# Patient Record
Sex: Female | Born: 1990 | Race: White | Hispanic: No | Marital: Single
Health system: Southern US, Community
[De-identification: ages and names within clinical notes are randomized; demographics above are authoritative.]

---
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
## 2012-08-23 ENCOUNTER — Emergency Department: Payer: Self-pay | Admitting: Emergency Medicine

## 2012-08-23 LAB — URINALYSIS, COMPLETE
Leukocyte Esterase: NEGATIVE
Nitrite: NEGATIVE
Ph: 6 (ref 4.5–8.0)
Protein: 100
RBC,UR: 8967 /HPF (ref 0–5)
Specific Gravity: 1.01 (ref 1.003–1.030)
Squamous Epithelial: 5
WBC UR: 35 /HPF (ref 0–5)

## 2012-08-23 LAB — COMPREHENSIVE METABOLIC PANEL
Alkaline Phosphatase: 69 U/L (ref 50–136)
BUN: 8 mg/dL (ref 7–18)
Bilirubin,Total: 0.4 mg/dL (ref 0.2–1.0)
Calcium, Total: 8.9 mg/dL (ref 8.5–10.1)
Creatinine: 0.71 mg/dL (ref 0.60–1.30)
EGFR (African American): >60
Glucose: 88 mg/dL (ref 65–99)
Potassium: 3.8 mmol/L (ref 3.5–5.1)
SGOT(AST): 22 U/L (ref 15–37)
SGPT (ALT): 17 U/L (ref 12–78)
Sodium: 137 mmol/L (ref 136–145)
Total Protein: 7.1 g/dL (ref 6.4–8.2)

## 2012-08-23 LAB — CBC
MCH: 31.7 pg (ref 26.0–34.0)
MCV: 91 fL (ref 80–100)
RBC: 4.12 10*6/uL (ref 3.80–5.20)

## 2012-09-17 ENCOUNTER — Ambulatory Visit: Payer: Self-pay | Admitting: Family Medicine

## 2013-06-02 ENCOUNTER — Emergency Department: Payer: Self-pay | Admitting: Emergency Medicine

## 2013-06-02 LAB — COMPREHENSIVE METABOLIC PANEL
ALBUMIN: 4.3 g/dL (ref 3.4–5.0)
Alkaline Phosphatase: 78 U/L
Anion Gap: 3 — ABNORMAL LOW (ref 7–16)
BUN: 13 mg/dL (ref 7–18)
Bilirubin,Total: 0.5 mg/dL (ref 0.2–1.0)
CALCIUM: 9.7 mg/dL (ref 8.5–10.1)
Chloride: 105 mmol/L (ref 98–107)
Co2: 29 mmol/L (ref 21–32)
Creatinine: 0.81 mg/dL (ref 0.60–1.30)
EGFR (African American): >60
Glucose: 125 mg/dL — ABNORMAL HIGH (ref 65–99)
Osmolality: 275 (ref 275–301)
Potassium: 4 mmol/L (ref 3.5–5.1)
SGOT(AST): 14 U/L — ABNORMAL LOW (ref 15–37)
SGPT (ALT): 19 U/L (ref 12–78)
Sodium: 137 mmol/L (ref 136–145)
TOTAL PROTEIN: 8.6 g/dL — AB (ref 6.4–8.2)

## 2013-06-02 LAB — ETHANOL
Ethanol %: <0.003 % (ref 0.000–0.080)
Ethanol: <3 mg/dL

## 2013-06-02 LAB — DRUG SCREEN, URINE
AMPHETAMINES, UR SCREEN: NEGATIVE (ref ?–1000)
BENZODIAZEPINE, UR SCRN: POSITIVE (ref ?–200)
Barbiturates, Ur Screen: NEGATIVE (ref ?–200)
Cannabinoid 50 Ng, Ur ~~LOC~~: NEGATIVE (ref ?–50)
Cocaine Metabolite,Ur ~~LOC~~: NEGATIVE (ref ?–300)
MDMA (ECSTASY) UR SCREEN: NEGATIVE (ref ?–500)
Methadone, Ur Screen: POSITIVE (ref ?–300)
OPIATE, UR SCREEN: NEGATIVE (ref ?–300)
Phencyclidine (PCP) Ur S: NEGATIVE (ref ?–25)
TRICYCLIC, UR SCREEN: NEGATIVE (ref ?–1000)

## 2013-06-02 LAB — URINALYSIS, COMPLETE
Bacteria: NONE SEEN
Bilirubin,UR: NEGATIVE
Glucose,UR: NEGATIVE mg/dL (ref 0–75)
Ketone: NEGATIVE
Leukocyte Esterase: NEGATIVE
Nitrite: NEGATIVE
Ph: 5 (ref 4.5–8.0)
Protein: 30
RBC,UR: 3 /HPF (ref 0–5)
Specific Gravity: 1.027 (ref 1.003–1.030)
Squamous Epithelial: 1
WBC UR: 3 /HPF (ref 0–5)

## 2013-06-02 LAB — CBC
HCT: 42.3 % (ref 35.0–47.0)
HGB: 13.8 g/dL (ref 12.0–16.0)
MCH: 29.2 pg (ref 26.0–34.0)
MCHC: 32.7 g/dL (ref 32.0–36.0)
MCV: 90 fL (ref 80–100)
Platelet: 321 10*3/uL (ref 150–440)
RBC: 4.73 10*6/uL (ref 3.80–5.20)
RDW: 13.5 % (ref 11.5–14.5)
WBC: 6.6 10*3/uL (ref 3.6–11.0)

## 2013-06-02 LAB — SALICYLATE LEVEL: Salicylates, Serum: 9.7 mg/dL — ABNORMAL HIGH

## 2013-06-02 LAB — ACETAMINOPHEN LEVEL

## 2013-10-02 ENCOUNTER — Emergency Department: Payer: Self-pay | Admitting: Emergency Medicine

## 2013-10-30 ENCOUNTER — Ambulatory Visit: Payer: Self-pay | Admitting: Family Medicine

## 2013-10-30 LAB — URINALYSIS, COMPLETE
BLOOD: NEGATIVE
Bacteria: NEGATIVE
Bilirubin,UR: NEGATIVE
GLUCOSE, UR: NEGATIVE
Ketone: NEGATIVE
LEUKOCYTE ESTERASE: NEGATIVE
Nitrite: NEGATIVE
PH: 8.5 (ref 5.0–8.0)
PROTEIN: NEGATIVE
RBC, UR: NONE SEEN /HPF (ref 0–5)
Specific Gravity: 1.015 (ref 1.000–1.030)
WBC UR: NONE SEEN /HPF (ref 0–5)

## 2013-10-30 LAB — PREGNANCY, URINE: Pregnancy Test, Urine: POSITIVE m[IU]/mL

## 2013-11-01 LAB — URINE CULTURE

## 2013-11-10 IMAGING — CT CT STONE STUDY
1 of 2 series · 15 of 32 positions shown, 19 images · non-contrast
Comparison: none

REASON FOR EXAM: right flank pain, hematuria
COMMENTS:

[Series 2: 3mm soft tissue · axial · 0.70mm/px · z∈[-896,-464]mm · 15 of 158 slices shown, 19 images]
[im 7/158  soft-tissue]
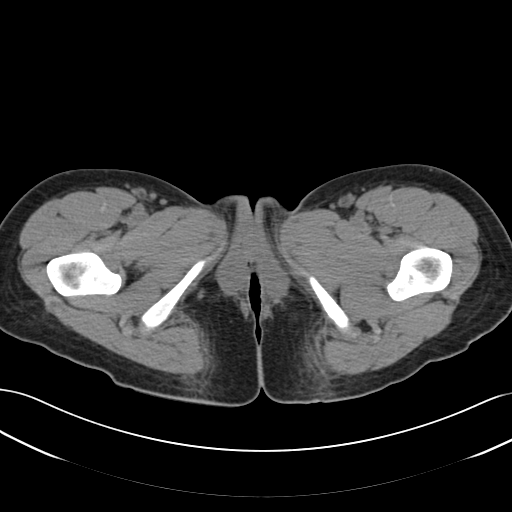
[im 7/158  bone]
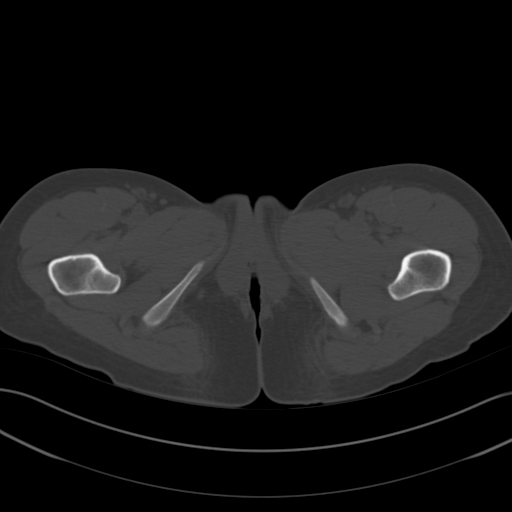
[im 20/158  soft-tissue]
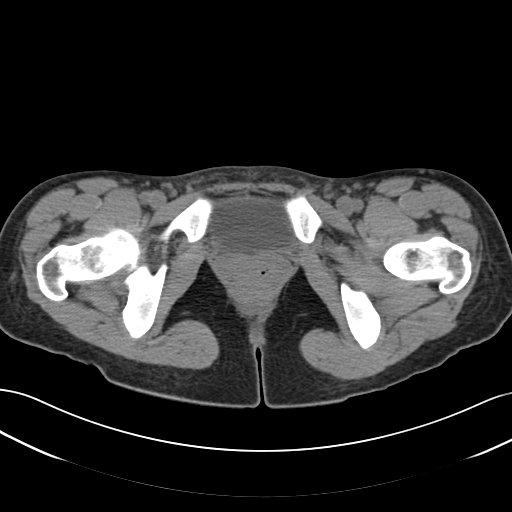
[im 33/158  soft-tissue]
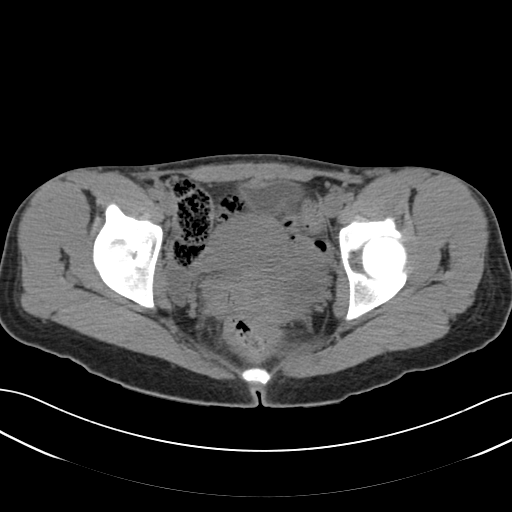
[im 46/158  soft-tissue]
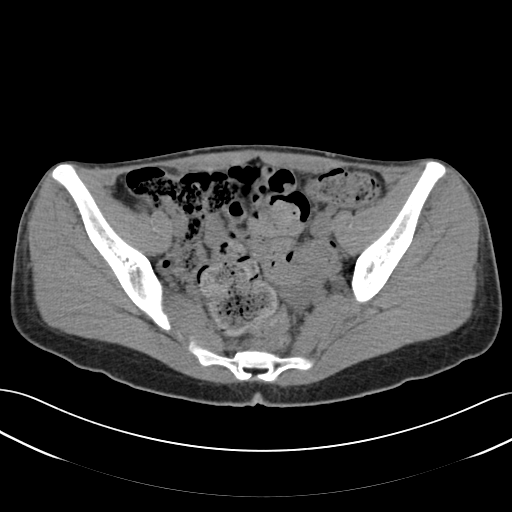
[im 53/158  soft-tissue]
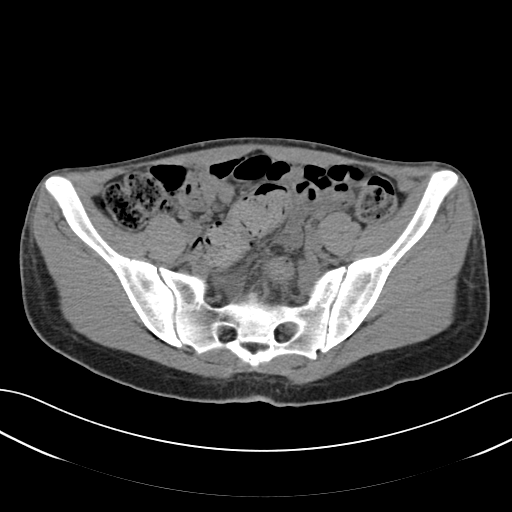
[im 66/158  soft-tissue]
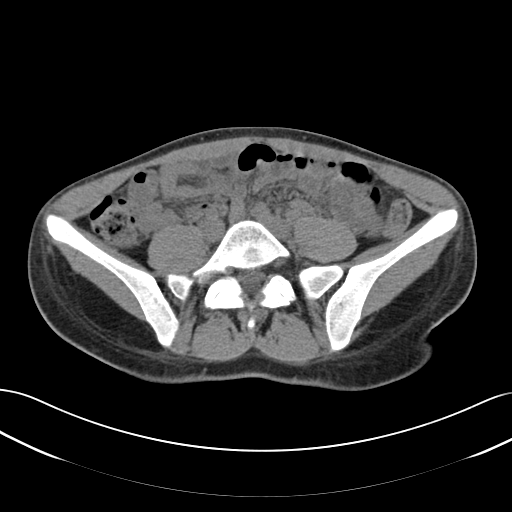
[im 79/158  soft-tissue]
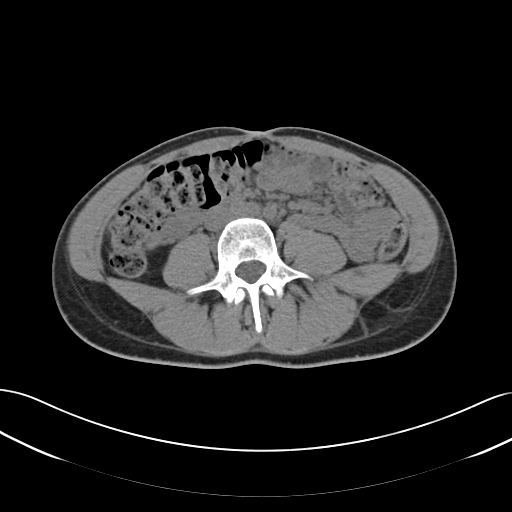
[im 92/158  soft-tissue]
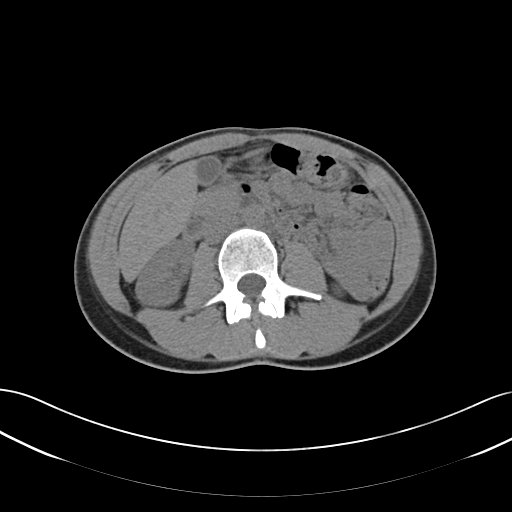
[im 105/158  soft-tissue]
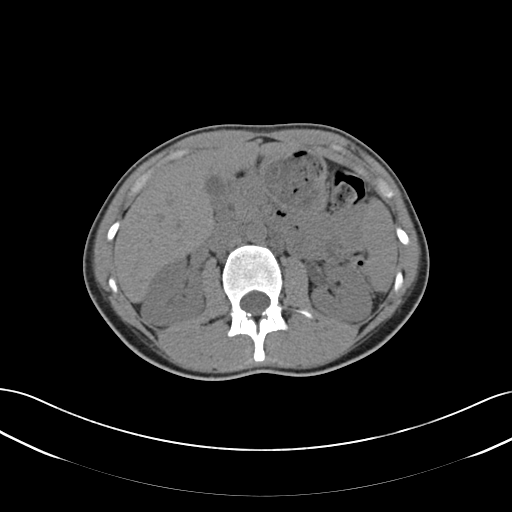
[im 105/158  bone]
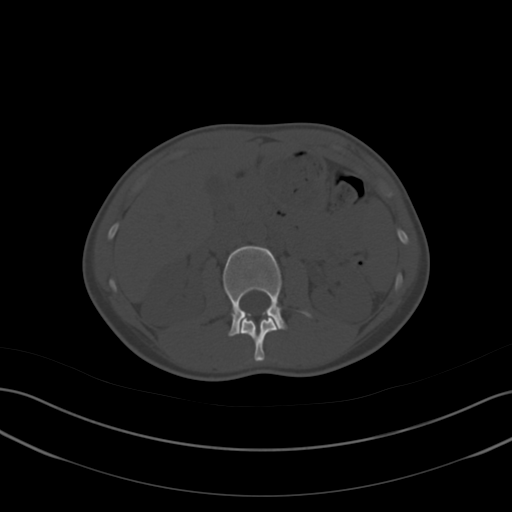
[im 112/158  soft-tissue]
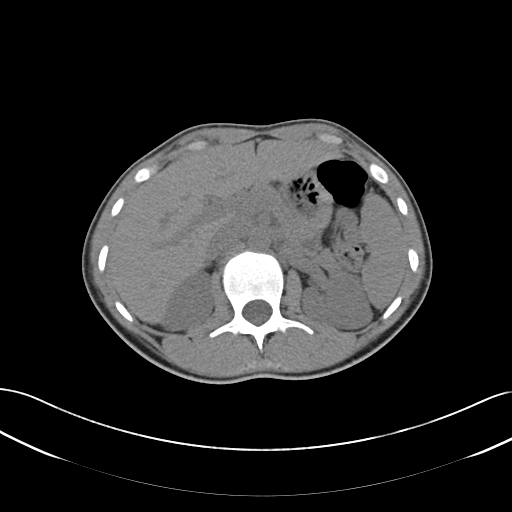
[im 125/158  soft-tissue]
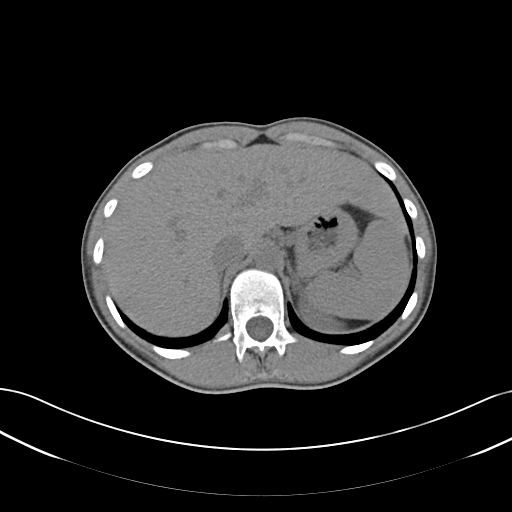
[im 131/158  lung]
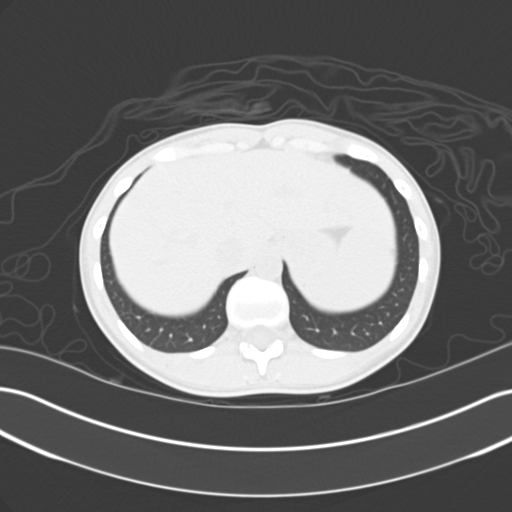
[im 138/158  soft-tissue]
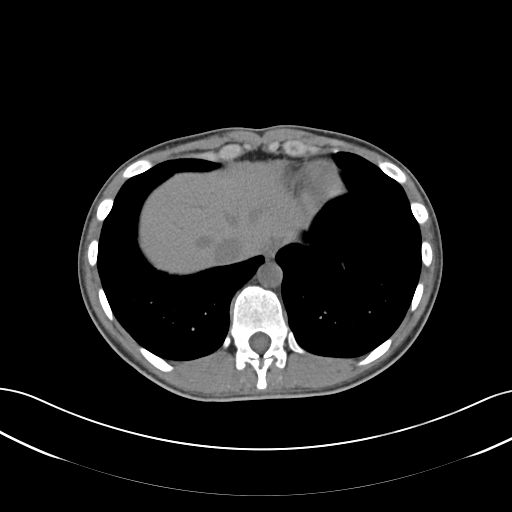
[im 138/158  lung]
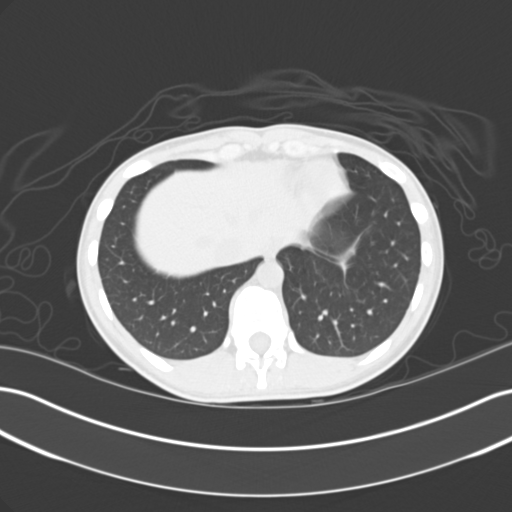
[im 144/158  lung]
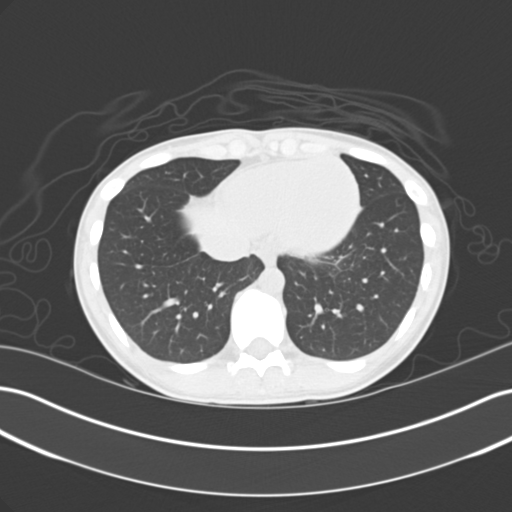
[im 151/158  soft-tissue]
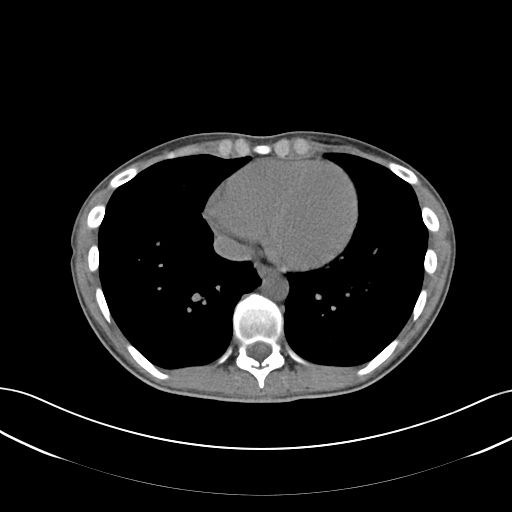
[im 151/158  lung]
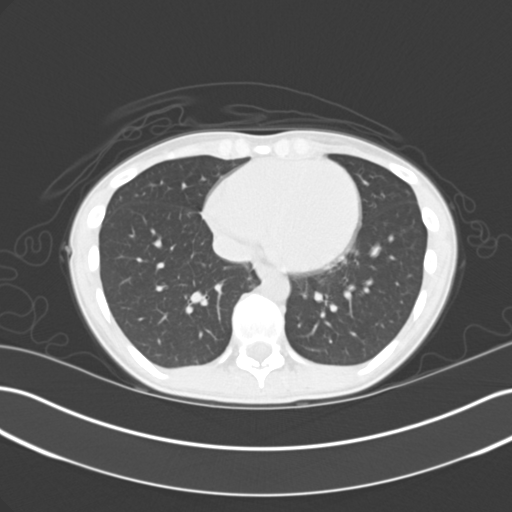

[15 of 32 positions shown; findings below may reference images not displayed]

PROCEDURE:     CT  - CT ABDOMEN /PELVIS WO (STONE)  - August 24, 2012  [DATE]

RESULT:     Axial noncontrast CT scanning was performed through the abdomen
and pelvis with reconstructions at 3 mm intervals and slice thicknesses.
Review of multiplanar reconstructed images was performed separately on the
VIA monitor.

The kidneys are normal in density and contour. No calcified stones are
evident and there is no hydronephrosis. No perinephric fluid collections are
demonstrated. Along the course of the ureters no calcified stones are
demonstrated. The partially distended urinary bladder is normal in
appearance.

There is a large amount of stool present throughout the colon that suggests
constipation. There is no evidence of ileus nor obstruction nor acute
inflammation. The liver, gallbladder, pancreas, spleen, nondistended
stomach, and adrenal glands are normal in appearance. The caliber of the
abdominal aorta is normal.

Within the pelvis there are low density structures bilaterally suggesting
cystic ovarian processes. On the left a 4.3 cm diameter presumed cyst
exhibits layering of radiodense material posteriorly within this structure
that may reflect hemorrhage. A similar smaller structure on the right
measures 2.4 x 1.6 cm. The uterus is normal in contour. There is a small
amount of free fluid in the pelvis. There is no inguinal nor umbilical
hernia. There is an umbilical ring present.

The lung bases are clear. The lumbar vertebral bodies are preserved in
height.
IMPRESSION: 1. There is no evidence of urinary tract stones or urinary tract obstruction.
2. There are nonspecific cystic adnexal processes. Given the appearance of
the structure on the left a hemorrhagic cyst is suspected. Correlation with
a pelvic ultrasound may be of value.
3. The bowel gas pattern suggests constipation.

A preliminary report was sent to the [HOSPITAL] the conclusion
of the study.

[REDACTED]

## 2013-11-16 ENCOUNTER — Emergency Department: Payer: Self-pay | Admitting: Emergency Medicine

## 2013-11-16 LAB — URINALYSIS, COMPLETE
BILIRUBIN, UR: NEGATIVE
BLOOD: NEGATIVE
GLUCOSE, UR: NEGATIVE mg/dL (ref 0–75)
KETONE: NEGATIVE
NITRITE: NEGATIVE
PH: 6 (ref 4.5–8.0)
PROTEIN: NEGATIVE
SPECIFIC GRAVITY: 1.014 (ref 1.003–1.030)
Squamous Epithelial: 10

## 2013-11-16 LAB — CBC
HCT: 36 % (ref 35.0–47.0)
HGB: 12.1 g/dL (ref 12.0–16.0)
MCH: 30.5 pg (ref 26.0–34.0)
MCHC: 33.6 g/dL (ref 32.0–36.0)
MCV: 91 fL (ref 80–100)
Platelet: 293 10*3/uL (ref 150–440)
RBC: 3.96 10*6/uL (ref 3.80–5.20)
RDW: 12.2 % (ref 11.5–14.5)
WBC: 6.9 10*3/uL (ref 3.6–11.0)

## 2013-11-16 LAB — HCG, QUANTITATIVE, PREGNANCY: BETA HCG, QUANT.: 133814 m[IU]/mL — AB

## 2013-11-16 LAB — WET PREP, GENITAL

## 2013-11-17 LAB — GC/CHLAMYDIA PROBE AMP

## 2013-12-02 ENCOUNTER — Emergency Department: Payer: Self-pay | Admitting: Emergency Medicine

## 2013-12-03 LAB — COMPREHENSIVE METABOLIC PANEL
ANION GAP: 7 (ref 7–16)
Albumin: 3.8 g/dL (ref 3.4–5.0)
Alkaline Phosphatase: 41 U/L — ABNORMAL LOW
BUN: 9 mg/dL (ref 7–18)
Bilirubin,Total: 0.4 mg/dL (ref 0.2–1.0)
CHLORIDE: 104 mmol/L (ref 98–107)
CO2: 25 mmol/L (ref 21–32)
CREATININE: 0.66 mg/dL (ref 0.60–1.30)
Calcium, Total: 8.5 mg/dL (ref 8.5–10.1)
GLUCOSE: 75 mg/dL (ref 65–99)
Osmolality: 269 (ref 275–301)
POTASSIUM: 3.5 mmol/L (ref 3.5–5.1)
SGOT(AST): 20 U/L (ref 15–37)
SGPT (ALT): 17 U/L
Sodium: 136 mmol/L (ref 136–145)
Total Protein: 7.2 g/dL (ref 6.4–8.2)

## 2013-12-03 LAB — CBC
HCT: 35.8 % (ref 35.0–47.0)
HGB: 11.9 g/dL — AB (ref 12.0–16.0)
MCH: 30.8 pg (ref 26.0–34.0)
MCHC: 33.3 g/dL (ref 32.0–36.0)
MCV: 93 fL (ref 80–100)
Platelet: 257 10*3/uL (ref 150–440)
RBC: 3.87 10*6/uL (ref 3.80–5.20)
RDW: 12.8 % (ref 11.5–14.5)
WBC: 9.7 10*3/uL (ref 3.6–11.0)

## 2013-12-03 LAB — URINALYSIS, COMPLETE
BILIRUBIN, UR: NEGATIVE
BLOOD: NEGATIVE
Glucose,UR: NEGATIVE mg/dL (ref 0–75)
Ketone: NEGATIVE
LEUKOCYTE ESTERASE: NEGATIVE
NITRITE: NEGATIVE
Ph: 5 (ref 4.5–8.0)
Protein: 100
RBC,UR: 1 /HPF (ref 0–5)
Specific Gravity: 1.017 (ref 1.003–1.030)
Squamous Epithelial: 6
WBC UR: 5 /HPF (ref 0–5)

## 2013-12-03 LAB — DRUG SCREEN, URINE
AMPHETAMINES, UR SCREEN: NEGATIVE (ref ?–1000)
Barbiturates, Ur Screen: NEGATIVE (ref ?–200)
Benzodiazepine, Ur Scrn: POSITIVE (ref ?–200)
COCAINE METABOLITE, UR ~~LOC~~: NEGATIVE (ref ?–300)
Cannabinoid 50 Ng, Ur ~~LOC~~: NEGATIVE (ref ?–50)
MDMA (Ecstasy)Ur Screen: NEGATIVE (ref ?–500)
METHADONE, UR SCREEN: NEGATIVE (ref ?–300)
Opiate, Ur Screen: POSITIVE (ref ?–300)
PHENCYCLIDINE (PCP) UR S: NEGATIVE (ref ?–25)
TRICYCLIC, UR SCREEN: NEGATIVE (ref ?–1000)

## 2013-12-03 LAB — ACETAMINOPHEN LEVEL: Acetaminophen: <2 ug/mL

## 2013-12-03 LAB — ETHANOL: Ethanol: <3 mg/dL

## 2013-12-03 LAB — SALICYLATE LEVEL

## 2013-12-03 LAB — HCG, QUANTITATIVE, PREGNANCY: Beta Hcg, Quant.: 167142 m[IU]/mL — ABNORMAL HIGH

## 2014-02-11 ENCOUNTER — Emergency Department: Payer: Self-pay | Admitting: Emergency Medicine

## 2014-03-28 ENCOUNTER — Emergency Department: Payer: Self-pay | Admitting: Emergency Medicine

## 2014-05-08 ENCOUNTER — Emergency Department
Admit: 2014-05-08 | Disposition: A | Discharge status: Home / Self Care | Payer: Self-pay | Admitting: Emergency Medicine

## 2014-05-08 LAB — COMPREHENSIVE METABOLIC PANEL
ANION GAP: 7 (ref 7–16)
Albumin: 4.6 g/dL
Alkaline Phosphatase: 55 U/L
BILIRUBIN TOTAL: 0.5 mg/dL
BUN: 8 mg/dL
CO2: 27 mmol/L
CREATININE: 0.69 mg/dL
Calcium, Total: 9.3 mg/dL
Chloride: 107 mmol/L
Glucose: 99 mg/dL
POTASSIUM: 3.4 mmol/L — AB
SGOT(AST): 26 U/L
SGPT (ALT): 42 U/L
SODIUM: 141 mmol/L
Total Protein: 7.6 g/dL

## 2014-05-08 LAB — URINALYSIS, COMPLETE
BILIRUBIN, UR: NEGATIVE
Bacteria: NONE SEEN
Blood: NEGATIVE
Glucose,UR: NEGATIVE mg/dL (ref 0–75)
Ketone: NEGATIVE
LEUKOCYTE ESTERASE: NEGATIVE
NITRITE: NEGATIVE
PROTEIN: NEGATIVE
Ph: 5 (ref 4.5–8.0)
Specific Gravity: 1.019 (ref 1.003–1.030)

## 2014-05-08 LAB — DRUG SCREEN, URINE
Amphetamines, Ur Screen: NEGATIVE
Barbiturates, Ur Screen: NEGATIVE
Benzodiazepine, Ur Scrn: POSITIVE
COCAINE METABOLITE, UR ~~LOC~~: NEGATIVE
Cannabinoid 50 Ng, Ur ~~LOC~~: POSITIVE
MDMA (Ecstasy)Ur Screen: NEGATIVE
Methadone, Ur Screen: NEGATIVE
OPIATE, UR SCREEN: NEGATIVE
PHENCYCLIDINE (PCP) UR S: NEGATIVE
Tricyclic, Ur Screen: NEGATIVE

## 2014-05-08 LAB — CBC
HCT: 40 % (ref 35.0–47.0)
HGB: 13.3 g/dL (ref 12.0–16.0)
MCH: 30.7 pg (ref 26.0–34.0)
MCHC: 33.3 g/dL (ref 32.0–36.0)
MCV: 92 fL (ref 80–100)
Platelet: 275 10*3/uL (ref 150–440)
RBC: 4.34 10*6/uL (ref 3.80–5.20)
RDW: 12.8 % (ref 11.5–14.5)
WBC: 5.5 10*3/uL (ref 3.6–11.0)

## 2014-05-08 LAB — ETHANOL

## 2014-05-08 NOTE — Consult Note (Signed)
PATIENT NAME:  Vanessa Ellis, Vanessa Ellis MR#:  [id number] DATE OF BIRTH:  1990/08/25  DATE OF CONSULTATION:  12/03/2013  REFERRING PHYSICIAN:   CONSULTING PHYSICIAN:  Audery AmelJohn T. Lillyana Majette, MD  IDENTIFYING INFORMATION AND REASON FOR CONSULTATION: A 24 year old woman brought into the hospital by police.   CHIEF COMPLAINT: "There so much going on."   HISTORY OF PRESENT ILLNESS: Information obtained from the patient and the chart. The patient states that [facility]. She admits that she used poor judgment and went inside and [place] [employer] [place]. She got arrested but became so emotionally overwrought that they brought her here to the hospital. There is also an allegation that she had been trying to hurt herself by pounding on her abdomen with her fists. The patient states that her mood has been depressed for several weeks. She feels down most of the time. Cries most of the time. Sleeps poorly. Poor appetite. She has been continuing to use benzodiazepines and narcotics, mostly Suboxone that she gets off the street. The patient denies however that she is having any suicidal ideation at all. Denies that she was trying to hurt herself when she was in jail. Denies any psychotic symptoms. She is not currently getting any outpatient psychiatric treatment and has not been getting any substance abuse treatment. Multiple stressors in her life include financially being overwhelmed, being estranged from her family and currently being pregnant.   PAST PSYCHIATRIC HISTORY: She was evaluated in the Emergency Room in May of this year for a similar situation and at that time also was released from the Emergency Room. She used to go to [facility] for outpatient care years ago, but has not been going in a long time. She denies that she has ever tried to kill herself in the past. Denies that she had been prescribed any medication for any depression or other psychiatric illness. She has tried to stop using  substances, but has continued to use Suboxone and Xanax that she buys on the street. No history of violence. No history of psychotic symptoms.   PAST MEDICAL HISTORY: The patient is currently pregnant. Ultrasound suggests a gestational age of [redacted] weeks. The patient says that she has known that she was pregnant for several weeks now and that she is planning to have an abortion, but has not been able to get the money together for it yet. She has not been getting any OB care. Denies having any other significant ongoing medical problems.   SOCIAL HISTORY: The patient lives with some friends. She says that they have gotten more fed up with her because of her substance use and she is not clear if she can go back and stay there. She is estranged from her family many of whom have substance abuse problems. She says that she is employed working at [employer]. Evidently got arrested for shoplifting yesterday.   FAMILY HISTORY: States that she has family members who also have substance abuse problems.   CURRENT MEDICATIONS: None.   ALLERGIES: No known drug allergies.   REVIEW OF SYSTEMS: Feeling sad. Poor sleep, poor appetite. Weak. Feeling depressed. Denies any auditory or visual hallucinations. Denies any suicidal or homicidal ideation. Denies any other specific acute physical symptoms.   MENTAL STATUS EXAMINATION: A somewhat disheveled young woman who looks her stated age. When I interviewed her she was very upset and was crying for most of the interview, but was cooperative. Eye contact intermittent. Psychomotor activity restricted. Speech was understandable through the crying.  When I went back later and she was no longer crying she was able to speak very normally. Normal rate, tone and volume. Affect sad but variable and controllable. Mood is stated as being depressed. Thoughts are lucid. No evidence of loosening of associations or delusions. Denies auditory or visual hallucinations. Denies any suicidal  ideation or wish to die. Denies homicidal ideation. She can recall 3/3 objects immediately and 2/3 at three minutes. She is alert and oriented x4. Baseline intelligence and fund of knowledge normal. Judgment and insight at the moment appears to be appropriate.   LABORATORY RESULTS: Has a drug screen positive for opiates and benzodiazepines. Urinalysis positive for protein. Does not appear to be infected. CBC unremarkable. Chemistry panel just a slightly low alkaline phosphatase. She has a very positive beta-hCG and an ultrasound transvaginally was done which showed a normal pregnancy.   VITAL SIGNS: Currently blood pressure 91/56, respirations 16, pulse 75, temperature 98.   ASSESSMENT: A 24 year old woman with a history of substance abuse and depression. She was brought in because of being emotionally overwhelmed. They had alleged that she had punched herself in the stomach and was trying to injure herself. The patient absolutely denies that. She denies any suicidal thought at all. She gives evidence of having positive plans for the future. She is not currently psychotic and appears to understand the pros and cons of her behavior. I offered her to consider inpatient hospitalization, but she does not want to do that. She states that she will go for outpatient treatment if we refer her.   TREATMENT PLAN: No longer meets commitment criteria. Commitment discontinued. Encouraged the patient to consider inpatient stabilization even if for a day or so but she declines. Reviewed with her the risks of continuing to be depressed. She states that she will go to [facility] as directed and will follow up with treatment there. The patient is aware of the risks of her continued substance abuse and has been advised to discuss this with outpatient treatment as well. The patient states that she intends to get an abortion at this point and seems to know what to do about that. The case discussed with the Emergency Room physician. She  can be released from the Emergency Room and will be given referral information.   DIAGNOSIS, PRINCIPAL AND PRIMARY:  AXIS I: Major depression, moderate.   SECONDARY DIAGNOSES: AXIS I:  1.  Opiate abuse.  2.  Benzodiazepine abuse.  AXIS II: Deferred.  AXIS III: Intrauterine pregnancy.  ____________________________ Audery Amel, MD [email or web address] D: 12/03/2013 13:47:40 ET T: 12/03/2013 14:31:42 ET JOB#: [id number]  cc: Audery Amel, MD, <Dictator> Audery Amel MD ELECTRONICALLY SIGNED 12/25/2013 19:06

## 2014-05-08 NOTE — Consult Note (Signed)
Brief Consult Note: Diagnosis: depression, benzo abuse.   Patient was seen by consultant.   Consult note dictated.   Discussed with Attending MD.   Comments: Psychiatry:PAtient seen and chart reviewed including prior tele-psych consult. Patient with depression and abusing drugs but absolutely denies any wish to die or kill self. Not psychotic. Agrees to go to outpt treatment. Counceling done. Discussed with ER MD. Will dc the IVC and patient can be released with suggested follow up [facility].  Electronic Signatures: Audery Amellapacs, John T (MD)  (Signed [id number]-Nov-15 11:56)  Authored: Brief Consult Note   Last Updated: 19-Nov-15 11:56 by Audery Amellapacs, John T (MD)
